# Patient Record
Sex: Female | Born: 1980 | Race: White | Hispanic: No | Marital: Married | Smoking: Never smoker
Health system: Southern US, Community
[De-identification: ages and names within clinical notes are randomized; demographics above are authoritative.]

## PROBLEM LIST (undated history)

## (undated) DIAGNOSIS — Z803 Family history of malignant neoplasm of breast: Secondary | ICD-10-CM

## (undated) DIAGNOSIS — Z1379 Encounter for other screening for genetic and chromosomal anomalies: Secondary | ICD-10-CM

## (undated) DIAGNOSIS — Z872 Personal history of diseases of the skin and subcutaneous tissue: Secondary | ICD-10-CM

## (undated) HISTORY — PX: NO PAST SURGERIES: SHX2092

## (undated) HISTORY — DX: Personal history of diseases of the skin and subcutaneous tissue: Z87.2

## (undated) HISTORY — DX: Family history of malignant neoplasm of breast: Z80.3

## (undated) HISTORY — DX: Encounter for other screening for genetic and chromosomal anomalies: Z13.79

---

## 2008-04-19 LAB — HM PAP SMEAR

## 2010-06-10 DIAGNOSIS — Z1379 Encounter for other screening for genetic and chromosomal anomalies: Secondary | ICD-10-CM

## 2010-06-10 HISTORY — DX: Encounter for other screening for genetic and chromosomal anomalies: Z13.79

## 2011-05-20 ENCOUNTER — Inpatient Hospital Stay: Payer: Self-pay

## 2013-02-24 ENCOUNTER — Inpatient Hospital Stay: Payer: Self-pay | Admitting: Obstetrics and Gynecology

## 2013-02-24 LAB — CBC WITH DIFFERENTIAL/PLATELET
Basophil #: 0.1 10*3/uL (ref 0.0–0.1)
Basophil %: 0.6 %
Eosinophil %: 0.8 %
Lymphocyte #: 3.2 10*3/uL (ref 1.0–3.6)
Lymphocyte %: 25.4 %
MCHC: 34 g/dL (ref 32.0–36.0)
MCV: 90 fL (ref 80–100)
Monocyte #: 0.8 x10 3/mm (ref 0.2–0.9)
Neutrophil #: 8.5 10*3/uL — ABNORMAL HIGH (ref 1.4–6.5)
RBC: 4.13 10*6/uL (ref 3.80–5.20)
RDW: 13.3 % (ref 11.5–14.5)

## 2013-02-26 LAB — HEMATOCRIT: HCT: 34.2 % — ABNORMAL LOW (ref 35.0–47.0)

## 2013-03-01 ENCOUNTER — Emergency Department: Payer: Self-pay | Admitting: Emergency Medicine

## 2014-10-18 NOTE — H&P (Signed)
L&D Evaluation:  History:  HPI Pt is a 34yo G2P1001 who is 39.[redacted] weeks GA who presents with SROM at 5pm during her office visit today. Pt reports having contractions last night that went away but started back today around 2-3pm and have been about 10 minutes apart. In the office she was 4/80/-1. She is A+, VI, RI, GBS+ and recieved her TDaP on 01/01/13.   Presents with leaking fluid   Patient's Medical History No Chronic Illness   Patient's Surgical History none   Medications Pre Natal Vitamins   Allergies NKDA   Social History none   Family History Non-Contributory   ROS:  ROS All systems were reviewed.  HEENT, CNS, GI, GU, Respiratory, CV, Renal and Musculoskeletal systems were found to be normal.   Exam:  Vital Signs stable   General no apparent distress   Mental Status clear   Chest clear   Heart normal sinus rhythm   Abdomen gravid, tender with contractions   Back no CVAT   Mebranes Ruptured   Description clear   FHT normal rate with no decels   Ucx irregular, Q 6-10 minutes   Skin dry, no lesions   Lymph no lymphadenopathy   Impression:  Impression IUP at 39.6, SROM, labor   Plan:  Plan EFM/NST, monitor contractions and for cervical change, antibiotics for GBBS prophylaxis   Electronic Signatures: Rivaldo Hineman, Toni Amendourtney (CNM)  (Signed 17-Sep-14 18:05)  Authored: L&D Evaluation   Last Updated: 17-Sep-14 18:05 by Jannet MantisSubudhi, Zyairah Wacha (CNM)

## 2016-10-09 ENCOUNTER — Ambulatory Visit: Payer: Self-pay | Admitting: Advanced Practice Midwife

## 2016-11-01 ENCOUNTER — Encounter: Payer: Self-pay | Admitting: Family Medicine

## 2016-11-01 ENCOUNTER — Ambulatory Visit (INDEPENDENT_AMBULATORY_CARE_PROVIDER_SITE_OTHER): Payer: BLUE CROSS/BLUE SHIELD | Admitting: Family Medicine

## 2016-11-01 VITALS — BP 116/71 | HR 81 | Temp 98.8°F | Ht 65.75 in | Wt 114.0 lb

## 2016-11-01 DIAGNOSIS — Z7689 Persons encountering health services in other specified circumstances: Secondary | ICD-10-CM

## 2016-11-01 MED ORDER — FLUTICASONE PROPIONATE 50 MCG/ACT NA SUSP: 2.0000 | Freq: Every day | NASAL | 6 refills | Status: DC

## 2016-11-01 NOTE — Progress Notes (Signed)
   BP 116/71   Pulse 81   Temp 98.8 F (37.1 C)   Ht 5' 5.75" (1.67 m)   Wt 114 lb (51.7 kg)   LMP 10/12/2016 (Exact Date)   SpO2 100%   BMI 18.54 kg/m    Subjective:    Patient ID: Rebecca Schultz, female    DOB: 1981/02/09, 36 y.o.   MRN: [id number]  HPI: Rebecca Schultz is a 36 y.o. female  Chief Complaint  Patient presents with  . Establish Care    just needs to establish care and schedule a CPE   Patient presents today to establish care. Last CPE was 12/2015.   Only known medical hx is ovarian and breast cysts that seem to come and go with her cycles intermittently. Has several ovarian cysts currently that are causing some mild intermittent discomfort but tolerable. [facility].  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 Currently feeling well on supplementation.   No other concerns today.   Relevant past medical, surgical, family and social history reviewed and updated as indicated. Interim medical history since our last visit reviewed. Allergies and medications reviewed and updated.  Review of Systems  Constitutional: Negative.   HENT: Negative.   Respiratory: Negative.   Gastrointestinal: Negative.   Endocrine: Negative.   Genitourinary: Positive for pelvic pain (b/l from ovarian cysts).  Musculoskeletal: Negative.   Neurological: Negative.   Psychiatric/Behavioral: Negative.     Per HPI unless specifically indicated above     Objective:    BP 116/71   Pulse 81   Temp 98.8 F (37.1 C)   Ht 5' 5.75" (1.67 m)   Wt 114 lb (51.7 kg)   LMP 10/12/2016 (Exact Date)   SpO2 100%   BMI 18.54 kg/m   Wt Readings from Last 3 Encounters:  11/01/16 114 lb (51.7 kg)    Physical Exam  Constitutional: She is oriented to person, place, and time. She appears well-developed and well-nourished. No distress.  HENT:  Head: Atraumatic.  Eyes:  Conjunctivae are normal. Pupils are equal, round, and reactive to light. No scleral icterus.  Neck: Normal range of motion. Neck supple.  Cardiovascular: Normal rate and normal heart sounds.   Pulmonary/Chest: Effort normal and breath sounds normal. No respiratory distress.  Abdominal: Soft. Bowel sounds are normal. There is tenderness (Mild b/l TTP over lower abdomen).  Musculoskeletal: Normal range of motion.  Neurological: She is alert and oriented to person, place, and time.  Skin: Skin is warm and dry.  Psychiatric: She has a normal mood and affect. Her behavior is normal.  Nursing note and vitals reviewed.     Assessment & Plan:   Problem List Items Addressed This Visit    None    Visit Diagnoses    Encounter to establish care    -  Primary   No concerns today. Will schedule CPE in 2 months when due.        Follow up plan: Return in about 2 months (around 01/01/2017) for CPE.

## 2016-11-29 ENCOUNTER — Encounter: Payer: Self-pay | Admitting: Certified Nurse Midwife

## 2016-11-29 ENCOUNTER — Ambulatory Visit (INDEPENDENT_AMBULATORY_CARE_PROVIDER_SITE_OTHER): Payer: BLUE CROSS/BLUE SHIELD | Admitting: Certified Nurse Midwife

## 2016-11-29 VITALS — BP 110/70 | HR 87 | Ht 66.0 in | Wt 116.0 lb

## 2016-11-29 DIAGNOSIS — N6320 Unspecified lump in the left breast, unspecified quadrant: Secondary | ICD-10-CM | POA: Diagnosis not present

## 2016-11-29 DIAGNOSIS — Z13220 Encounter for screening for lipoid disorders: Secondary | ICD-10-CM | POA: Diagnosis not present

## 2016-11-29 DIAGNOSIS — Z124 Encounter for screening for malignant neoplasm of cervix: Secondary | ICD-10-CM

## 2016-11-29 DIAGNOSIS — Z1329 Encounter for screening for other suspected endocrine disorder: Secondary | ICD-10-CM

## 2016-11-29 DIAGNOSIS — R1030 Lower abdominal pain, unspecified: Secondary | ICD-10-CM

## 2016-11-29 DIAGNOSIS — Z131 Encounter for screening for diabetes mellitus: Secondary | ICD-10-CM | POA: Diagnosis not present

## 2016-11-29 DIAGNOSIS — Z803 Family history of malignant neoplasm of breast: Secondary | ICD-10-CM | POA: Diagnosis not present

## 2016-11-29 DIAGNOSIS — Z01419 Encounter for gynecological examination (general) (routine) without abnormal findings: Secondary | ICD-10-CM | POA: Diagnosis not present

## 2016-11-29 DIAGNOSIS — N6310 Unspecified lump in the right breast, unspecified quadrant: Secondary | ICD-10-CM

## 2016-11-29 NOTE — Progress Notes (Signed)
Gynecology Annual Exam  PCP: Volney American, PA-C  Chief Complaint:  Chief Complaint  Patient presents with  . Gynecologic Exam    History of Present Illness:Rebecca Schultz presents today for her annual exam. She is a 36 year old Caucasian/White female, G 2 P 2 0 0 2, whose LMP was 11/06/2016. Her menses are monthly, and they last 5-7 days. Some menses are heavier flow requiring her to empty her Diva cup 2-3 x/day. Two to three months ago she passed a grayish tissue appearing clot x1.  Pt states she has mild discomfort with ovulation, but over the last 6-12 months has been having more frequent lower abdominal pains in the LLQ more frequently then the RLQ, but sometimes the pain is bilateral. She describes the pain as soreness or a bruised feeling and rates the pain 4-5 out of 10. Sitting down will make the pain worse. She has not taken any analgesics for the pain. A heating pad is often helpful. Denies GI or GU symptoms accompanying the pain. Pt has taken OCPs in the distant past and reports feeling unwell with several formulations. She denies dysmenorrhea.  The patient's past medical history is detailed in the past medical history section. Since her last GYN exam 01/04/2015, she has noticed bruising more easily. She also has felt some tender breast "cysts", and started taking kelp/ iodine supplements to see if that would help them resolve. She does not drink caffinated products   Pt states her husband now has Stage 4 anal cancer (diagnosed in 2016) She is sexually active. She is currently using a vasectomy for contraception. She denies any history of STDs.  Her most recent pap smear was obtained 04/05/2013 and was negative cells with negative HPV DNA.  Mammogram is not applicable. There is a positive history of breast cancer in her maternal aunt and paternal aunt. Genetic testing has been done. Patient tested negative for BRCA 1 and 2 and BART. There is no family history of ovarian  cancer. The patient does do monthly self breast exams.  The patient does not smoke.  The patient does drink infrequently.  The patient does not use illegal drugs.  The patient exercises regularly. She has a BMI of 18.72 kg/m2   The patient does not get adequate calcium in her diet.  She has not had a recent cholesterol screen and is interested in labwork.      Review of Systems: Review of Systems  Constitutional: Negative for chills, fever and weight loss.  HENT: Negative for congestion, sinus pain and sore throat.   Eyes: Negative for blurred vision and pain.  Respiratory: Negative for hemoptysis, shortness of breath and wheezing.   Cardiovascular: Negative for chest pain, palpitations and leg swelling.  Gastrointestinal: Positive for abdominal pain. Negative for blood in stool, diarrhea, heartburn, nausea and vomiting.  Genitourinary: Negative for dysuria, frequency, hematuria and urgency.  Musculoskeletal: Negative for back pain, joint pain and myalgias.  Skin: Negative for itching and rash.  Neurological: Negative for dizziness, tingling and headaches.  Endo/Heme/Allergies: Negative for environmental allergies and polydipsia. Does not bruise/bleed easily.       Negative for hirsutism   Psychiatric/Behavioral: Negative for depression. The patient is not nervous/anxious and does not have insomnia.     Past Medical History:  Past Medical History:  Diagnosis Date  . Genetic testing of female 2012   BRCA negative  . History of cellulitis     Past Surgical History:  Past Surgical  History:  Procedure Laterality Date  . NO PAST SURGERIES      Family History:  Family History  Problem Relation Age of Onset  . Diabetes Mother        pre diabetes  . Hyperlipidemia Mother   . Hyperlipidemia Father   . Hypertension Father   . Cancer Maternal Aunt        breast  . Heart Problems Maternal Uncle        heart valve abnormailty  . Cancer Paternal Aunt        breast  .  Diabetes Maternal Grandmother   . Cancer Paternal Grandmother        colon/lung  . Colon cancer Paternal Grandmother 73    Social History:  Social History   Social History  . Marital status: Married    Spouse name: N/A  . Number of children: N/A  . Years of education: N/A   Occupational History  . Not on file.   Social History Main Topics  . Smoking status: Never Smoker  . Smokeless tobacco: Never Used  . Alcohol use No  . Drug use: No  . Sexual activity: Yes    Birth control/ protection: Other-see comments     Comment: vasectomy   Other Topics Concern  . Not on file   Social History Narrative  . No narrative on file    Allergies:  No Known Allergies  Medications: Prior to Admission medications   Medication Sig Start Date End Date Taking? Authorizing Provider  B Complex Vitamins (B COMPLEX 1 PO) Take by mouth 3 (three) times a week.   Yes [provider]  Cholecalciferol (VITAMIN D3) 10000 units TABS Take by mouth 3 (three) times a week.   Yes [provider]  DHA-EPA-GLA PO Take by mouth.   Yes [provider]  fluticasone (FLONASE) 50 MCG/ACT nasal spray Place 2 sprays into both nostrils daily. 11/01/16  Yes Volney American, PA-C  IODINE, KELP, PO Take 12.5 mg by mouth 2 (two) times daily.   Yes [provider]  Multiple Vitamin (MULTIVITAMIN) tablet Take 1 tablet by mouth daily.   Yes [provider]  Probiotic Product (PROBIOTIC-10 PO) Take by mouth daily.   Yes [provider]    Physical Exam Vitals: BP 110/70   Pulse 87   Ht 5' 6"  (1.676 m)   Wt 116 lb (52.6 kg)   LMP 11/06/2016 (Exact Date)   BMI 18.72 kg/m .  General: thin WF in NAD HEENT: normocephalic, anicteric Neck: no thyroid enlargement, no palpable nodules, no cervical lymphadenopathy  Pulmonary: No increased work of breathing, CTAB Cardiovascular: RRR, without murmur  Breast: Small breasts with 2 cm mass in right breast under  areola at 12 o'clock and another smaller mass in left breast at 2 o'clock (see diagram).  No skin or nipple retraction present, no nipple discharge.  No axillary, infraclavicular or supraclavicular lymphadenopathy. Abdomen: Soft, scaphoid, non-distended. Mild tenderness in LLQ.  Umbilicus without lesions.  No hepatomegaly or masses palpable. No evidence of hernia. Genitourinary:  External: Normal external female genitalia.  Normal urethral meatus, normal Bartholin's and Skene's glands.    Vagina: Normal vaginal mucosa, no evidence of prolapse.    Cervix: Grossly normal in appearance, no bleeding, non-tender  Uterus: Anteverted, normal size, shape, and consistency, mobile, and non-tender  Adnexa: No adnexal masses, non-tender  Rectal: deferred  Lymphatic: shoddy LAN in left inguinal area Extremities: no edema, erythema, or tenderness Neurologic: Grossly intact Psychiatric:  mood appropriate, affect full  Physical Exam  Pulmonary/Chest:    2cm oval mass in right breast at 12 0'clock under areola. Oval shaped smaller mass at 2 o'clock in left breast just outside areola. Both masses well defined borders and mobile. Dense area in left breast at 12 o'clock under areola.     Assessment: 36 y.o. [id number] annual gyn exam Bilateral breast masses Lower abdominal pain Easy bruising  Plan:  1. Screening for thyroid disorder - TSH  2. Lower abdominal pain - CBC with Differential/Platelet - Basic metabolic panel - US Transvaginal Non-OB; Future  3. Screening for hyperlipidemia - Lipid Panel With LDL/HDL Ratio  4. Screening for diabetes mellitus - Hemoglobin A1c  5. Screening for cervical cancer - IGP,CtNg,AptimaHPV  6. Breast mass, right  - MM DIAG BREAST TOMO BILATERAL; Future - US BREAST LTD UNI RIGHT INC AXILLA; Future  7. Breast mass, left  - MM DIAG BREAST TOMO BILATERAL; Future - US BREAST LTD UNI LEFT INC AXILLA; Future  8. Follow up after ultrasound  Dalia Heading,  CNM

## 2016-11-30 ENCOUNTER — Encounter: Payer: Self-pay | Admitting: Certified Nurse Midwife

## 2016-11-30 DIAGNOSIS — Z803 Family history of malignant neoplasm of breast: Secondary | ICD-10-CM | POA: Insufficient documentation

## 2016-11-30 LAB — CBC WITH DIFFERENTIAL/PLATELET
Basophils Absolute: 0 10*3/uL (ref 0.0–0.2)
Basos: 1 %
EOS (ABSOLUTE): 0.1 10*3/uL (ref 0.0–0.4)
EOS: 2 %
HEMATOCRIT: 36.6 % (ref 34.0–46.6)
HEMOGLOBIN: 12 g/dL (ref 11.1–15.9)
IMMATURE GRANS (ABS): 0 10*3/uL (ref 0.0–0.1)
Immature Granulocytes: 0 %
LYMPHS: 26 %
Lymphocytes Absolute: 1.6 10*3/uL (ref 0.7–3.1)
MCH: 30.4 pg (ref 26.6–33.0)
MCHC: 32.8 g/dL (ref 31.5–35.7)
MCV: 93 fL (ref 79–97)
MONOCYTES: 10 %
Monocytes Absolute: 0.6 10*3/uL (ref 0.1–0.9)
NEUTROS ABS: 3.8 10*3/uL (ref 1.4–7.0)
Neutrophils: 61 %
Platelets: 241 10*3/uL (ref 150–379)
RBC: 3.95 x10E6/uL (ref 3.77–5.28)
RDW: 13.5 % (ref 12.3–15.4)
WBC: 6.1 10*3/uL (ref 3.4–10.8)

## 2016-11-30 LAB — LIPID PANEL WITH LDL/HDL RATIO
CHOLESTEROL TOTAL: 174 mg/dL (ref 100–199)
HDL: 88 mg/dL (ref >39)
LDL CALC: 72 mg/dL (ref 0–99)
LDL/HDL RATIO: 0.8 ratio (ref 0.0–3.2)
TRIGLYCERIDES: 68 mg/dL (ref 0–149)
VLDL CHOLESTEROL CAL: 14 mg/dL (ref 5–40)

## 2016-11-30 LAB — BASIC METABOLIC PANEL
BUN/Creatinine Ratio: 16 (ref 9–23)
BUN: 11 mg/dL (ref 6–20)
CO2: 23 mmol/L (ref 20–29)
CREATININE: 0.67 mg/dL (ref 0.57–1.00)
Calcium: 9.7 mg/dL (ref 8.7–10.2)
Chloride: 102 mmol/L (ref 96–106)
GFR calc Af Amer: 131 mL/min/{1.73_m2} (ref >59)
GFR, EST NON AFRICAN AMERICAN: 113 mL/min/{1.73_m2} (ref >59)
Glucose: 82 mg/dL (ref 65–99)
Potassium: 4.2 mmol/L (ref 3.5–5.2)
SODIUM: 140 mmol/L (ref 134–144)

## 2016-11-30 LAB — HEMOGLOBIN A1C
Est. average glucose Bld gHb Est-mCnc: 105 mg/dL
Hgb A1c MFr Bld: 5.3 % (ref 4.8–5.6)

## 2016-11-30 LAB — TSH: TSH: 5.1 u[IU]/mL — ABNORMAL HIGH (ref 0.450–4.500)

## 2016-12-05 LAB — IGP,CTNG,APTIMAHPV
CHLAMYDIA, NUC. ACID AMP: NEGATIVE
Gonococcus by Nucleic Acid Amp: NEGATIVE
HPV Aptima: NEGATIVE
PAP Smear Comment: 0

## 2016-12-06 ENCOUNTER — Ambulatory Visit
Admission: RE | Admit: 2016-12-06 | Discharge: 2016-12-06 | Disposition: A | Discharge status: Home / Self Care | Payer: BLUE CROSS/BLUE SHIELD | Source: Ambulatory Visit | Attending: Certified Nurse Midwife | Admitting: Certified Nurse Midwife

## 2016-12-06 DIAGNOSIS — N6320 Unspecified lump in the left breast, unspecified quadrant: Secondary | ICD-10-CM

## 2016-12-06 DIAGNOSIS — N6310 Unspecified lump in the right breast, unspecified quadrant: Secondary | ICD-10-CM | POA: Diagnosis present

## 2016-12-10 ENCOUNTER — Encounter: Payer: Self-pay | Admitting: Certified Nurse Midwife

## 2016-12-12 ENCOUNTER — Encounter: Payer: Self-pay | Admitting: Certified Nurse Midwife

## 2016-12-12 ENCOUNTER — Other Ambulatory Visit (INDEPENDENT_AMBULATORY_CARE_PROVIDER_SITE_OTHER): Payer: BLUE CROSS/BLUE SHIELD

## 2016-12-12 ENCOUNTER — Ambulatory Visit (INDEPENDENT_AMBULATORY_CARE_PROVIDER_SITE_OTHER): Payer: BLUE CROSS/BLUE SHIELD | Admitting: Certified Nurse Midwife

## 2016-12-12 VITALS — BP 102/66 | HR 74 | Ht 66.0 in | Wt 113.0 lb

## 2016-12-12 DIAGNOSIS — R946 Abnormal results of thyroid function studies: Secondary | ICD-10-CM

## 2016-12-12 DIAGNOSIS — R7989 Other specified abnormal findings of blood chemistry: Secondary | ICD-10-CM

## 2016-12-12 DIAGNOSIS — R1030 Lower abdominal pain, unspecified: Secondary | ICD-10-CM | POA: Diagnosis not present

## 2016-12-12 DIAGNOSIS — R1031 Right lower quadrant pain: Secondary | ICD-10-CM

## 2016-12-12 DIAGNOSIS — R1032 Left lower quadrant pain: Secondary | ICD-10-CM | POA: Diagnosis not present

## 2016-12-12 NOTE — Progress Notes (Signed)
  HPI: 36 year old WF G2 P2 who presents for a follow up visit. When seen for a recent annual exam she had complained of BLAP, left>right. Ultrasound today was normal. EM stripe was 5.5915mm and no masses or FF was seen. Current form of birth control is vasectomy. Her husband has stage 4 anal cancer.  Another concern was some bilateral breast lumps-a diagnostic mammogram and ultrasound failed to show any masses, only dense fibroglandular tissue. Patient states the lumps she was feeling have resolved. All of her labs were normal except for her TSH which was slightly elevated at 5.100. She has stopped taking kelp a few days ago, which she was taking for breast pain. Advised her that kelp can affect thyroid function.    PMHx: She  has a past medical history of Family history of breast cancer; Genetic testing of female (2012); and History of cellulitis. Also,  has a past surgical history that includes No past surgeries., family history includes Breast cancer (age of onset: 2240) in her maternal aunt; Breast cancer (age of onset: 6260) in her paternal aunt; Cancer in her maternal aunt and paternal aunt; Colon cancer (age of onset: 6850) in her paternal grandmother; Diabetes in her maternal grandmother and mother; Heart Problems in her maternal uncle; Hyperlipidemia in her father and mother; Hypertension in her father and mother.,  reports that she has never smoked. She has never used smokeless tobacco. She reports that she does not drink alcohol or use drugs.  She has a current medication list which includes the following prescription(s): b complex vitamins, vitamin d3, dha-epa-gla, fluticasone, iodine (kelp), multivitamin, and probiotic product. Also, has No Known Allergies.  ROS  Objective: BP 102/66   Pulse 74   Ht 5\' 6"  (1.676 m)   Wt 113 lb (51.3 kg)   LMP 12/06/2016 (Exact Date)   BMI 18.24 kg/m   Physical examination Constitutional NAD, Conversant  Skin No rashes, lesions or ulceration.     Extremities: Moves all appropriately.  Normal ROM for age. No lymphadenopathy.  Neuro: Grossly intact  Psych: Oriented to PPT.  Normal mood. Normal affect.   Assessment:  BLAP, no evidence of masses on ultrasound Mildly elevated TSH, probably due to kelp ingestion Breast masses have resolved. No masses seen on mammogram/ultrasound  PLan: Discussed findings of ultrasound and ultrasound limitations in detecting some causes of pelvic pain, like endometriosis. Advised that it is possible that the pain is from a non gyn source. Recommend referral to GYN MD for persistent pelvic pain Repeat TSH and free T4 in 3-6 mos. RTO 1 year for annual  Farrel Connersolleen Nylee Barbuto, [place]

## 2017-01-06 ENCOUNTER — Encounter: Payer: Self-pay | Admitting: Certified Nurse Midwife

## 2017-01-13 ENCOUNTER — Encounter: Payer: BLUE CROSS/BLUE SHIELD | Admitting: Family Medicine

## 2017-01-23 ENCOUNTER — Ambulatory Visit
Admission: RE | Admit: 2017-01-23 | Discharge: 2017-01-23 | Disposition: A | Discharge status: Home / Self Care | Payer: BLUE CROSS/BLUE SHIELD | Source: Ambulatory Visit | Attending: Family Medicine | Admitting: Family Medicine

## 2017-01-23 ENCOUNTER — Encounter: Payer: Self-pay | Admitting: Family Medicine

## 2017-01-23 ENCOUNTER — Ambulatory Visit (INDEPENDENT_AMBULATORY_CARE_PROVIDER_SITE_OTHER): Payer: BLUE CROSS/BLUE SHIELD | Admitting: Family Medicine

## 2017-01-23 ENCOUNTER — Telehealth: Payer: Self-pay | Admitting: Family Medicine

## 2017-01-23 VITALS — BP 123/76 | HR 86 | Temp 99.1°F | Wt 112.0 lb

## 2017-01-23 DIAGNOSIS — R1012 Left upper quadrant pain: Secondary | ICD-10-CM

## 2017-01-23 DIAGNOSIS — R7989 Other specified abnormal findings of blood chemistry: Secondary | ICD-10-CM

## 2017-01-23 DIAGNOSIS — R946 Abnormal results of thyroid function studies: Secondary | ICD-10-CM | POA: Diagnosis not present

## 2017-01-23 NOTE — Telephone Encounter (Signed)
Patient notified

## 2017-01-23 NOTE — Telephone Encounter (Signed)
Please call pt and let her know that her xray was normal but did show a moderate stool burden and fair amount of stomach gas. Try prilosec and miralax to see if that gives you any relief.

## 2017-01-23 NOTE — Progress Notes (Signed)
BP 123/76   Pulse 86   Temp 99.1 F (37.3 C)   Wt 112 lb (50.8 kg)   LMP 01/20/2017 (Approximate)   SpO2 100%   BMI 18.08 kg/m    Subjective:    Patient ID: Rebecca Schultz, female    DOB: 1980/10/02, 36 y.o.   MRN: [id number]  HPI: Rebecca Schultz is a 36 y.o. female  Chief Complaint  Patient presents with  . Thyroid Problem    She had her TSH checked at GYN and it was elevated and they wanted her to follow up  . Abdominal Pain    off and on for months. LUQ pain right under her rib, fullness, occasionally cramping, pressure.    Patient presents with concerns about an elevated TSH at her recent GYN physical. Denies any fatigue, constipation, weight gain, dry skin or hair, depression, or other sxs. Does feel some fullness in her neck, but this is nothing new. No known hx of abnormal thyroid function.   Also continues to have LUQ abd discomfort and fullness with occasional cramping. Comes and goes, no recognized pattern or triggers per pt. BMs continue to be regular, daily. Does not feel particularly bloated. Has not tried anything OTC for sxs. Denies N/V/D, HAs, fevers.   Relevant past medical, surgical, family and social history reviewed and updated as indicated. Interim medical history since our last visit reviewed. Allergies and medications reviewed and updated.  Review of Systems  Constitutional: Negative.   HENT: Negative.   Respiratory: Negative.   Cardiovascular: Negative.   Gastrointestinal: Positive for abdominal pain.  Musculoskeletal: Negative.   Neurological: Negative.   Psychiatric/Behavioral: Negative.    Per HPI unless specifically indicated above     Objective:    BP 123/76   Pulse 86   Temp 99.1 F (37.3 C)   Wt 112 lb (50.8 kg)   LMP 01/20/2017 (Approximate)   SpO2 100%   BMI 18.08 kg/m   Wt Readings from Last 3 Encounters:  01/23/17 112 lb (50.8 kg)  12/12/16 113 lb (51.3 kg)  11/29/16 116 lb (52.6 kg)    Physical Exam  Constitutional:  She is oriented to person, place, and time. She appears well-developed and well-nourished. No distress.  HENT:  Head: Atraumatic.  Eyes: Pupils are equal, round, and reactive to light. Conjunctivae are normal.  Neck: Normal range of motion. Neck supple.  Cardiovascular: Normal rate and normal heart sounds.   Pulmonary/Chest: Effort normal and breath sounds normal. No respiratory distress.  Abdominal: Soft. Bowel sounds are normal. She exhibits no distension and no mass. There is no tenderness. There is no rebound.  Musculoskeletal: Normal range of motion.  Neurological: She is alert and oriented to person, place, and time.  Skin: Skin is warm and dry.  Psychiatric: She has a normal mood and affect. Her behavior is normal.  Nursing note and vitals reviewed.  Results for orders placed or performed in visit on 01/23/17  TSH + free T4  Result Value Ref Range   TSH 0.960 0.450 - 4.500 uIU/mL   Free T4 1.13 0.82 - 1.77 ng/dL      Assessment & Plan:   Problem List Items Addressed This Visit    None    Visit Diagnoses    LUQ pain    -  Primary   Relevant Orders   DG Abd 1 View (Completed)   Elevated TSH       Will recheck TSH and T4. Discussed with pt that  it it is mild and treatment is not absolutely necessary. Will continue to monitor for sxs and recheck    Pt opting for KUB given duration of sxs. Discussed likelihood of gas/bloating causing her sxs. Start keeping a food diary to see if a trigger can be identified. Start simethicone and miralax prn. Trial of cutting out dairy and gluten. Await x-ray imaging.   Follow up plan: Return in about 6 months (around 07/26/2017) for Thyroid recheck.

## 2017-01-24 LAB — TSH+FREE T4
FREE T4: 1.13 ng/dL (ref 0.82–1.77)
TSH: 0.96 u[IU]/mL (ref 0.450–4.500)

## 2017-01-25 NOTE — Patient Instructions (Signed)
Follow up in 6 months depending on lab results

## 2017-07-28 ENCOUNTER — Ambulatory Visit: Payer: BLUE CROSS/BLUE SHIELD | Admitting: Family Medicine

## 2017-09-10 ENCOUNTER — Telehealth: Payer: Self-pay

## 2017-09-10 NOTE — Telephone Encounter (Signed)
This patient will need an appointment for clearance. Need ok from [place] before scheduling.

## 2017-09-10 NOTE — Telephone Encounter (Signed)
Copied from CRM [id number]#[id number]. Topic: General - Other >> Sep 09, 2017  3:59 PM Debroah LoopLander, Lumin L wrote: Reason for CRM: Patient calling to request an electrocardiogram, recommended by her plastic surgeon Dr. Lemmie EvensMichael Law in [place]. Please call her back to let her know when or if we can get her in for this.

## 2017-09-11 NOTE — Telephone Encounter (Signed)
LVM for patient to return phone call to set up appt for surgical clearance. [facility] [place].

## 2017-09-11 NOTE — Telephone Encounter (Signed)
Appointment scheduled.

## 2017-09-11 NOTE — Telephone Encounter (Signed)
OK to schedule

## 2017-09-12 ENCOUNTER — Encounter: Payer: Self-pay | Admitting: Family Medicine

## 2017-09-12 ENCOUNTER — Ambulatory Visit (INDEPENDENT_AMBULATORY_CARE_PROVIDER_SITE_OTHER): Payer: BLUE CROSS/BLUE SHIELD | Admitting: Family Medicine

## 2017-09-12 VITALS — BP 116/68 | HR 77 | Temp 98.2°F | Ht 66.0 in | Wt 118.5 lb

## 2017-09-12 DIAGNOSIS — Z01818 Encounter for other preprocedural examination: Secondary | ICD-10-CM

## 2017-09-12 DIAGNOSIS — R9431 Abnormal electrocardiogram [ECG] [EKG]: Secondary | ICD-10-CM | POA: Diagnosis not present

## 2017-09-12 NOTE — Progress Notes (Signed)
   BP 116/68 (BP Location: Right Arm, Patient Position: Sitting, Cuff Size: Normal)   Pulse 77   Temp 98.2 F (36.8 C) (Oral)   Ht 5\' 6"  (1.676 m)   Wt 118 lb 8 oz (53.8 kg)   SpO2 100%   BMI 19.13 kg/m    Subjective:    Patient ID: Rebecca Schultz, female    DOB: 1980/06/21, 37 y.o.   MRN: [id number]  HPI: Rebecca Schultz is a 37 y.o. female  Chief Complaint  Patient presents with  . Surgical Clearance   Pt here today for cardiac clearance for elective plastic surgery on her breasts. Has already completed lab and mammogram requirements, surgeon just wanting EKG. No known cardiac hx. Last saw a Cardiologist around age 89 for some chest pains with no abnormal findings on workup. No active chest sxs, feels very well. Denies CP,SOB, palpitations, diaphoresis.   Relevant past medical, surgical, family and social history reviewed and updated as indicated. Interim medical history since our last visit reviewed. Allergies and medications reviewed and updated.  Review of Systems  Per HPI unless specifically indicated above     Objective:    BP 116/68 (BP Location: Right Arm, Patient Position: Sitting, Cuff Size: Normal)   Pulse 77   Temp 98.2 F (36.8 C) (Oral)   Ht 5\' 6"  (1.676 m)   Wt 118 lb 8 oz (53.8 kg)   SpO2 100%   BMI 19.13 kg/m   Wt Readings from Last 3 Encounters:  09/12/17 118 lb 8 oz (53.8 kg)  01/23/17 112 lb (50.8 kg)  12/12/16 113 lb (51.3 kg)    Physical Exam  Constitutional: She is oriented to person, place, and time. She appears well-developed and well-nourished. No distress.  HENT:  Head: Atraumatic.  Eyes: Pupils are equal, round, and reactive to light. Conjunctivae are normal.  Neck: Normal range of motion. Neck supple.  Cardiovascular: Normal rate and normal heart sounds.  Pulmonary/Chest: Effort normal and breath sounds normal. No respiratory distress.  Musculoskeletal: Normal range of motion.  Neurological: She is alert and oriented to person,  place, and time.  Skin: Skin is warm and dry.  Psychiatric: She has a normal mood and affect. Her behavior is normal.  Nursing note and vitals reviewed.   Results for orders placed or performed in visit on 01/23/17  TSH + free T4  Result Value Ref Range   TSH 0.960 0.450 - 4.500 uIU/mL   Free T4 1.13 0.82 - 1.77 ng/dL      Assessment & Plan:   Problem List Items Addressed This Visit    None    Visit Diagnoses    Pre-operative clearance    -  Primary   Labs and other pre-op workup completed previously. EKG today abnormal x 2, will send to Cardiology for further clearance and any additional workup needed   Relevant Orders   EKG 12-Lead (Completed)   EKG 12-Lead (Completed)   Abnormal EKG       EKG x 2 today showing atrial enlargement and possible RBBB, given elective nature of procedure will send to Cardiology for further clearance       Follow up plan: Return for CPE.

## 2017-09-15 NOTE — Patient Instructions (Signed)
Follow up for CPE 

## 2017-09-15 NOTE — Addendum Note (Signed)
Addended by: Roosvelt MaserLANE, Keilyn Nadal E on: 09/15/2017 05:43 AM   Modules accepted: Orders

## 2017-11-06 ENCOUNTER — Ambulatory Visit: Payer: BLUE CROSS/BLUE SHIELD | Admitting: Internal Medicine

## 2017-11-06 ENCOUNTER — Encounter

## 2018-03-01 IMAGING — DX DG ABDOMEN 1V
1 series · 1 of 1 positions shown · non-contrast
Comparison: None in PACs

CLINICAL DATA: Several month history of left upper quadrant pain
anteriorly under the ribcage. Occasional numbness and cramping here.
Normal bowel movements. No nausea or vomiting.

EXAM:
ABDOMEN - 1 VIEW

[abdomen kub]
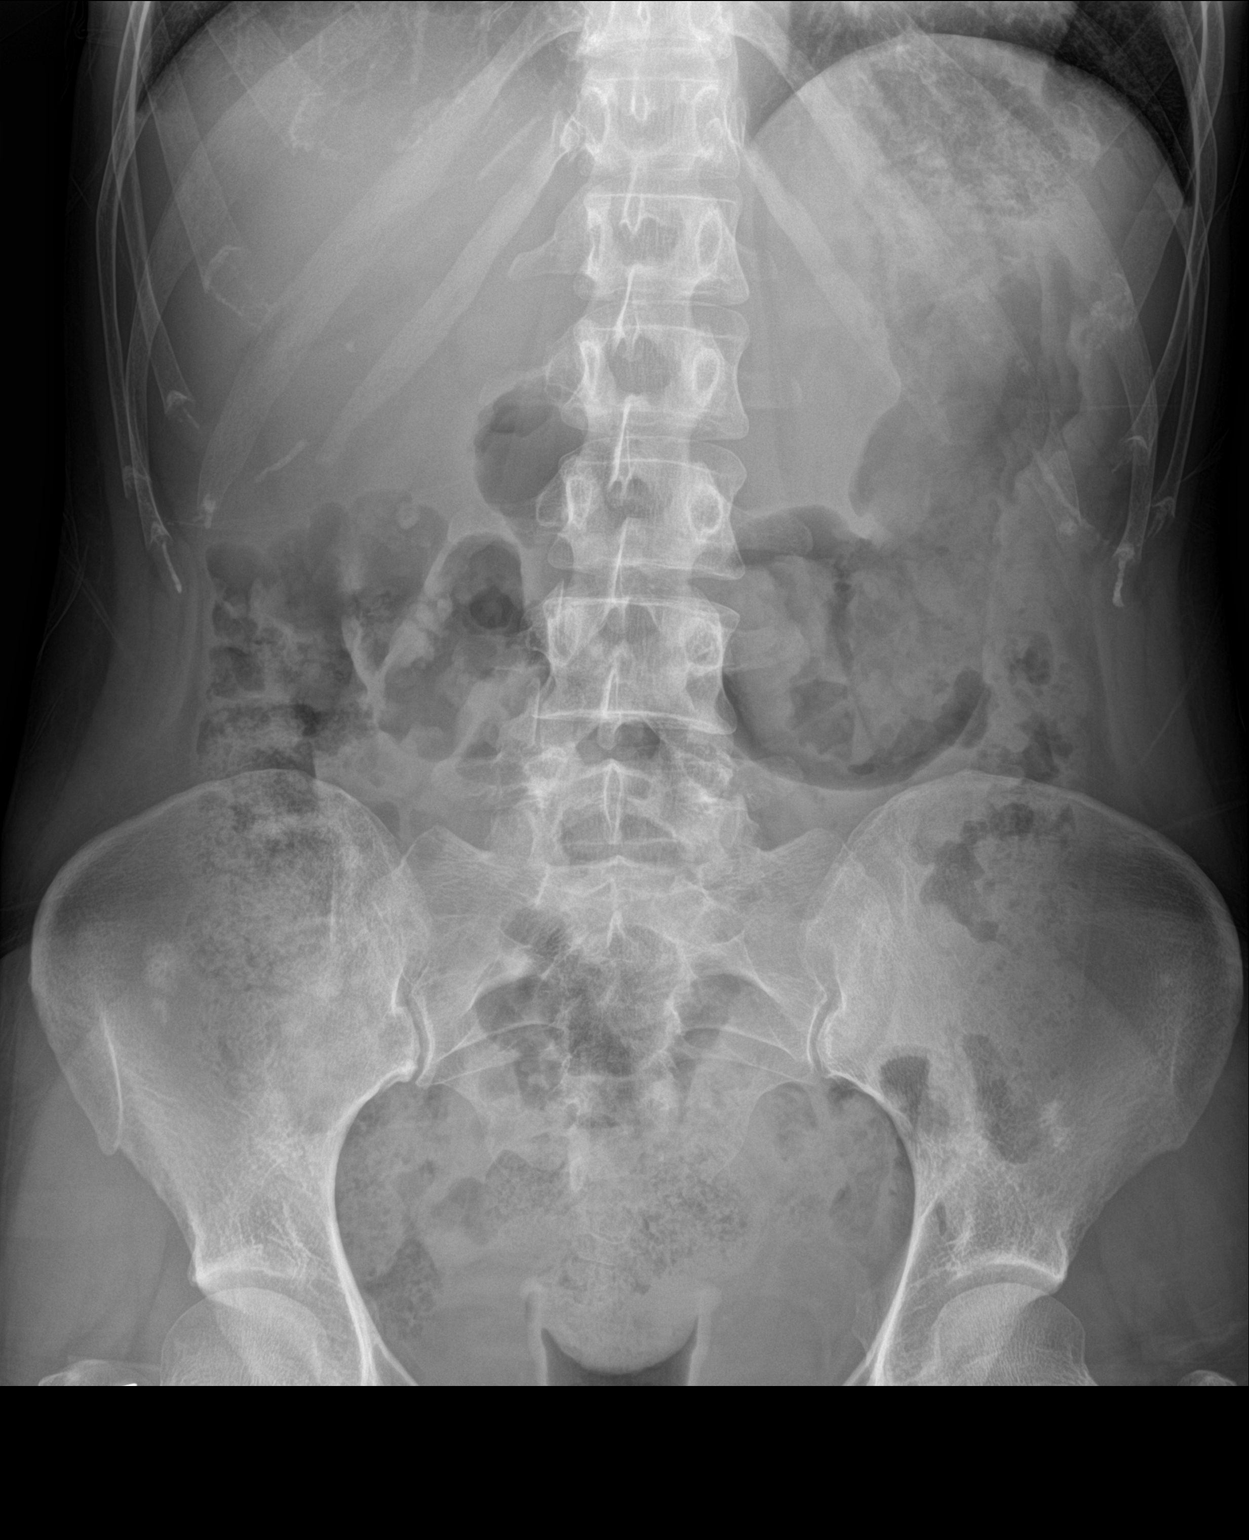

[1 of 1 positions shown; findings below may reference images not displayed]

FINDINGS: The colonic stool burden is moderate. There is no small or large
bowel obstructive pattern. There is a moderate amount of gas within
the stomach. New there are rounded radiodensities projecting over
the right kidney which may lie within the fecal pool or could
reflect stones. A punctate calcification in the right upper quadrant
likely lies within the liver. The bony structures are unremarkable.
IMPRESSION: No acute intra-abdominal abnormality is observed. I see no findings
to explain the patient's symptoms. If further imaging is felt
indicated, MRI would be a useful modality.
